# Patient Record
Sex: Male | Born: 1995 | Race: White | Hispanic: No | Marital: Single | Smoking: Never smoker
Health system: Southern US, Community
[De-identification: ages and names within clinical notes are randomized; demographics above are authoritative.]

## PROBLEM LIST (undated history)

## (undated) DIAGNOSIS — Z789 Other specified health status: Secondary | ICD-10-CM

## (undated) HISTORY — DX: Other specified health status: Z78.9

---
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
## 2018-01-08 ENCOUNTER — Encounter: Payer: Self-pay | Admitting: Family Medicine

## 2018-10-14 HISTORY — PX: WISDOM TOOTH EXTRACTION: SHX21

## 2020-01-13 ENCOUNTER — Ambulatory Visit: Payer: Medicaid Other | Attending: Internal Medicine

## 2020-01-13 DIAGNOSIS — Z23 Encounter for immunization: Secondary | ICD-10-CM

## 2020-01-13 NOTE — Progress Notes (Signed)
   Covid-19 Vaccination Clinic  Name:  Kenn Rekowski    MRN: [id number] DOB: December 12, 1995  01/13/2020  Mr. Nippert was observed post Covid-19 immunization for 15 minutes without incident. He was provided with Vaccine Information Sheet and instruction to access the V-Safe system.   Mr. Vanvranken was instructed to call 911 with any severe reactions post vaccine: Marland Kitchen Difficulty breathing  . Swelling of face and throat  . A fast heartbeat  . A bad rash all over body  . Dizziness and weakness   Immunizations Administered    Name Date Dose VIS Date Route   Pfizer COVID-19 Vaccine 01/13/2020  4:41 PM 0.3 mL 09/24/2019 Intramuscular   Manufacturer: [employer], [employer]   Lot: [id number]   NDC: [address]-9

## 2020-02-08 ENCOUNTER — Ambulatory Visit: Payer: Medicaid Other | Attending: Internal Medicine

## 2020-02-08 DIAGNOSIS — Z23 Encounter for immunization: Secondary | ICD-10-CM

## 2020-02-08 NOTE — Progress Notes (Signed)
   Covid-19 Vaccination Clinic  Name:  Max Hood    MRN: [id number] DOB: 10/16/1995  02/08/2020  [name] was observed post Covid-19 immunization for 15 minutes without incident. He was provided with Vaccine Information Sheet and instruction to access the V-Safe system.   [name] was instructed to call 911 with any severe reactions post vaccine: Marland Kitchen Difficulty breathing  . Swelling of face and throat  . A fast heartbeat  . A bad rash all over body  . Dizziness and weakness   Immunizations Administered    Name Date Dose VIS Date Route   Pfizer COVID-19 Vaccine 02/08/2020  4:21 PM 0.3 mL 12/08/2018 Intramuscular   Manufacturer: [employer], [employer]   Lot: [id number]   NDC: [address]-2

## 2020-07-05 ENCOUNTER — Telehealth: Payer: Self-pay

## 2020-07-05 NOTE — Telephone Encounter (Signed)
LMOM for ov on 9/23

## 2020-07-06 ENCOUNTER — Ambulatory Visit: Payer: Medicaid Other | Admitting: Nurse Practitioner

## 2020-08-02 ENCOUNTER — Telehealth: Payer: Self-pay

## 2020-08-02 NOTE — Telephone Encounter (Signed)
Lmom to confirm and screen for 08-03-20 ov. 

## 2020-08-03 ENCOUNTER — Telehealth: Payer: Self-pay

## 2020-08-03 ENCOUNTER — Ambulatory Visit: Payer: Medicaid Other | Admitting: Hospice and Palliative Medicine

## 2020-08-03 NOTE — Telephone Encounter (Signed)
NEW PATIENT HAS MISSED TWICE FOR NEW PT APPT DO NOT SCHEDULE WITH [facility]

## 2020-11-03 ENCOUNTER — Emergency Department: Payer: Medicaid Other

## 2020-11-03 ENCOUNTER — Emergency Department
Admission: EM | Admit: 2020-11-03 | Discharge: 2020-11-03 | Disposition: A | Discharge status: Home / Self Care | Payer: Medicaid Other | Attending: Emergency Medicine | Admitting: Emergency Medicine

## 2020-11-03 ENCOUNTER — Encounter: Payer: Self-pay | Admitting: Emergency Medicine

## 2020-11-03 ENCOUNTER — Other Ambulatory Visit: Payer: Self-pay

## 2020-11-03 DIAGNOSIS — Y92219 Unspecified school as the place of occurrence of the external cause: Secondary | ICD-10-CM | POA: Insufficient documentation

## 2020-11-03 DIAGNOSIS — Y990 Civilian activity done for income or pay: Secondary | ICD-10-CM | POA: Insufficient documentation

## 2020-11-03 DIAGNOSIS — S93401A Sprain of unspecified ligament of right ankle, initial encounter: Secondary | ICD-10-CM | POA: Insufficient documentation

## 2020-11-03 DIAGNOSIS — M25571 Pain in right ankle and joints of right foot: Secondary | ICD-10-CM

## 2020-11-03 DIAGNOSIS — W1840XA Slipping, tripping and stumbling without falling, unspecified, initial encounter: Secondary | ICD-10-CM | POA: Insufficient documentation

## 2020-11-03 MED ORDER — MELOXICAM 15 MG PO TABS: 15.0000 mg | ORAL_TABLET | Freq: Every day | ORAL | 0 refills | Status: AC

## 2020-11-03 NOTE — ED Triage Notes (Signed)
Pt to ED via POV, pt states that he was at work today and slipped in some water and twisted his right ankle. Pt states that he works at [employer] at [employer]. Pt is able to put weight on his ankle. Pt is in NAD.

## 2020-11-04 NOTE — ED Provider Notes (Signed)
[facility] Emergency Department Provider Note  ____________________________________________   Event Date/Time   First MD Initiated Contact with Patient 11/03/20 1633     (approximate)  I have reviewed the triage vital signs and the nursing notes.   HISTORY  Chief Complaint Ankle Pain  HPI Max Hood is a 25 y.o. male who presents to the emergency department for evaluation of right ankle pain.  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  The patient rates his pain a 7 out of 10.  He states that he laid on the ground initially for about 5 minutes but was then able to get up and become productive.  He has been able to weight-bear since that time.  He denies any weakness, numbness or tingling.  He has not tried any other alleviating measures.  Pain is worse with ambulation.         History reviewed. No pertinent past medical history.  There are no problems to display for this patient.   History reviewed. No pertinent surgical history.  Prior to Admission medications   Medication Sig Start Date End Date Taking? Authorizing Provider  meloxicam (MOBIC) 15 MG tablet Take 1 tablet (15 mg total) by mouth daily for 15 days. 11/03/20 11/18/20 Yes Lucy Chris, PA    Allergies Patient has no known allergies.  No family history on file.  Social History Social History   Tobacco Use  . Smoking status: Never Smoker  . Smokeless tobacco: Never Used  Substance Use Topics  . Alcohol use: Not Currently  . Drug use: Not Currently    Review of Systems Constitutional: No fever/chills Eyes: No visual changes. ENT: No sore throat. Cardiovascular: Denies chest pain. Respiratory: Denies shortness of breath. Gastrointestinal: No abdominal pain.  No nausea, no vomiting.  No diarrhea.  No constipation. Genitourinary: Negative for  dysuria. Musculoskeletal: + Right ankle pain, negative for back pain. Skin: Negative for rash. Neurological: Negative for headaches, focal weakness or numbness.   ____________________________________________   PHYSICAL EXAM:  VITAL SIGNS: ED Triage Vitals  Enc Vitals Group     BP 11/03/20 1440 125/70     Pulse Rate 11/03/20 1440 92     Resp 11/03/20 1440 16     Temp 11/03/20 1440 98.5 F (36.9 C)     Temp Source 11/03/20 1440 Oral     SpO2 11/03/20 1440 98 %     Weight 11/03/20 1441 165 lb (74.8 kg)     Height 11/03/20 1441 5\' 10"  (1.778 m)     Head Circumference --      Peak Flow --      Pain Score 11/03/20 1441 7     Pain Loc --      Pain Edu? --      Excl. in GC? --    Constitutional: Alert and oriented. Well appearing and in no acute distress. Eyes: Conjunctivae are normal. PERRL. EOMI. Head: Atraumatic. Nose: No congestion/rhinnorhea. Mouth/Throat: Mucous membranes are moist.  Neck: No stridor.   Musculoskeletal: There is mild soft tissue swelling to the dorsum and lateral aspect of the right foot.  There is tenderness over the region of the ATFL.  There is no tenderness over the distal fibula, proximal fifth metatarsal.  Patient has full range of motion of the right ankle with pain at end  range.  Dorsal pedal pulse 2+.  Capillary refill less than 3 seconds. Neurologic:  Normal speech and language. No gross focal neurologic deficits are appreciated. No gait instability. Skin:  Skin is warm, dry and intact. No rash noted. Psychiatric: Mood and affect are normal. Speech and behavior are normal.    ____________________________________________  RADIOLOGY I, Lucy Chris, personally viewed and evaluated these images (plain radiographs) as part of my medical decision making, as well as reviewing the written report by the radiologist.  ED provider interpretation: No acute fracture identified of the right  ankle  ____________________________________________    INITIAL IMPRESSION / ASSESSMENT AND PLAN / ED COURSE  As part of my medical decision making, I reviewed the following data within the electronic MEDICAL RECORD NUMBER Nursing notes reviewed and incorporated and Radiograph reviewed         Patient is a 25 year old male who presents to the emergency department for evaluation of right ankle pain after he got it stuck under the front wheel of an airplane at his flight school earlier today.  See HPI for further details.  On physical exam, the patient has tenderness mostly in the soft tissue region of the lateral ankle.  There is no bony tenderness noted.  Patient is neurovascularly intact.  X-rays reveal no acute fracture.  Most likely an acute ankle sprain.  We will provide the patient a lace up ankle brace.  Recommended Tylenol and Mobic for pain and symptom management.  The patient is amenable with this plan he stable at time for outpatient therapy.  He will follow-up with Ortho if he has any lingering deficits or return for any acute worsening.      ____________________________________________   FINAL CLINICAL IMPRESSION(S) / ED DIAGNOSES  Final diagnoses:  Acute right ankle pain  Sprain of right ankle, unspecified ligament, initial encounter     ED Discharge Orders         Ordered    meloxicam (MOBIC) 15 MG tablet  Daily        11/03/20 1639          *Please note:  Max Hood was evaluated in Emergency Department on 11/04/2020 for the symptoms described in the history of present illness. He was evaluated in the context of the global COVID-19 pandemic, which necessitated consideration that the patient might be at risk for infection with the SARS-CoV-2 virus that causes COVID-19. Institutional protocols and algorithms that pertain to the evaluation of patients at risk for COVID-19 are in a state of rapid change based on information released by regulatory bodies including the CDC  and federal and state organizations. These policies and algorithms were followed during the patient's care in the ED.  Some ED evaluations and interventions may be delayed as a result of limited staffing during and the pandemic.*   Note:  This document was prepared using Dragon voice recognition software and may include unintentional dictation errors.   Lucy Chris, PA 11/04/20 2059    Jene Every, MD 11/05/20 [phone]

## 2021-04-12 IMAGING — CR DG ANKLE COMPLETE 3+V*R*
1 series · 3 of 3 positions shown · non-contrast
Comparison: None.

CLINICAL DATA: Right ankle pain after fall today.

EXAM:
RIGHT ANKLE - COMPLETE 3+ VIEW

[Series 1: dg ankle complete right · 0.14mm/px · 3 of 3 slices shown]
[im 1/3]
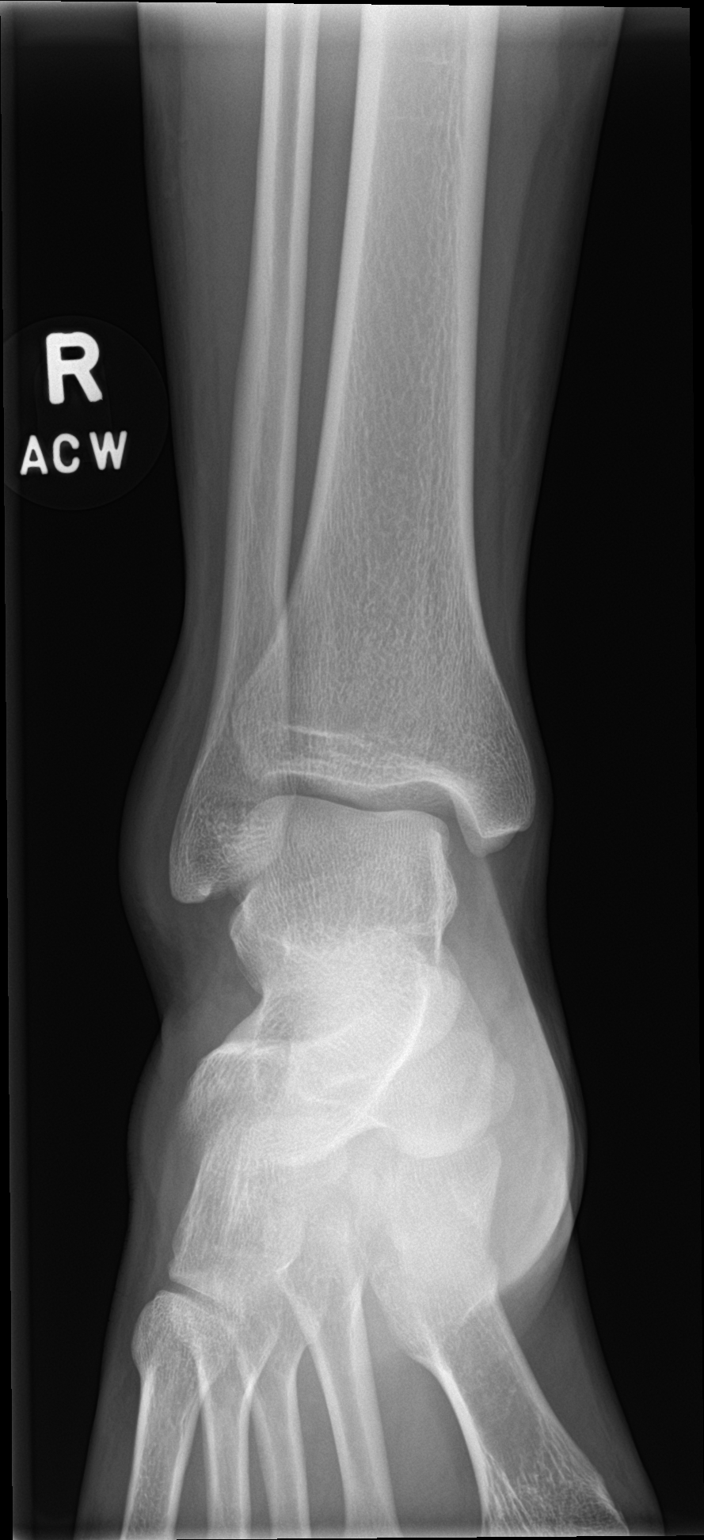
[im 2/3]
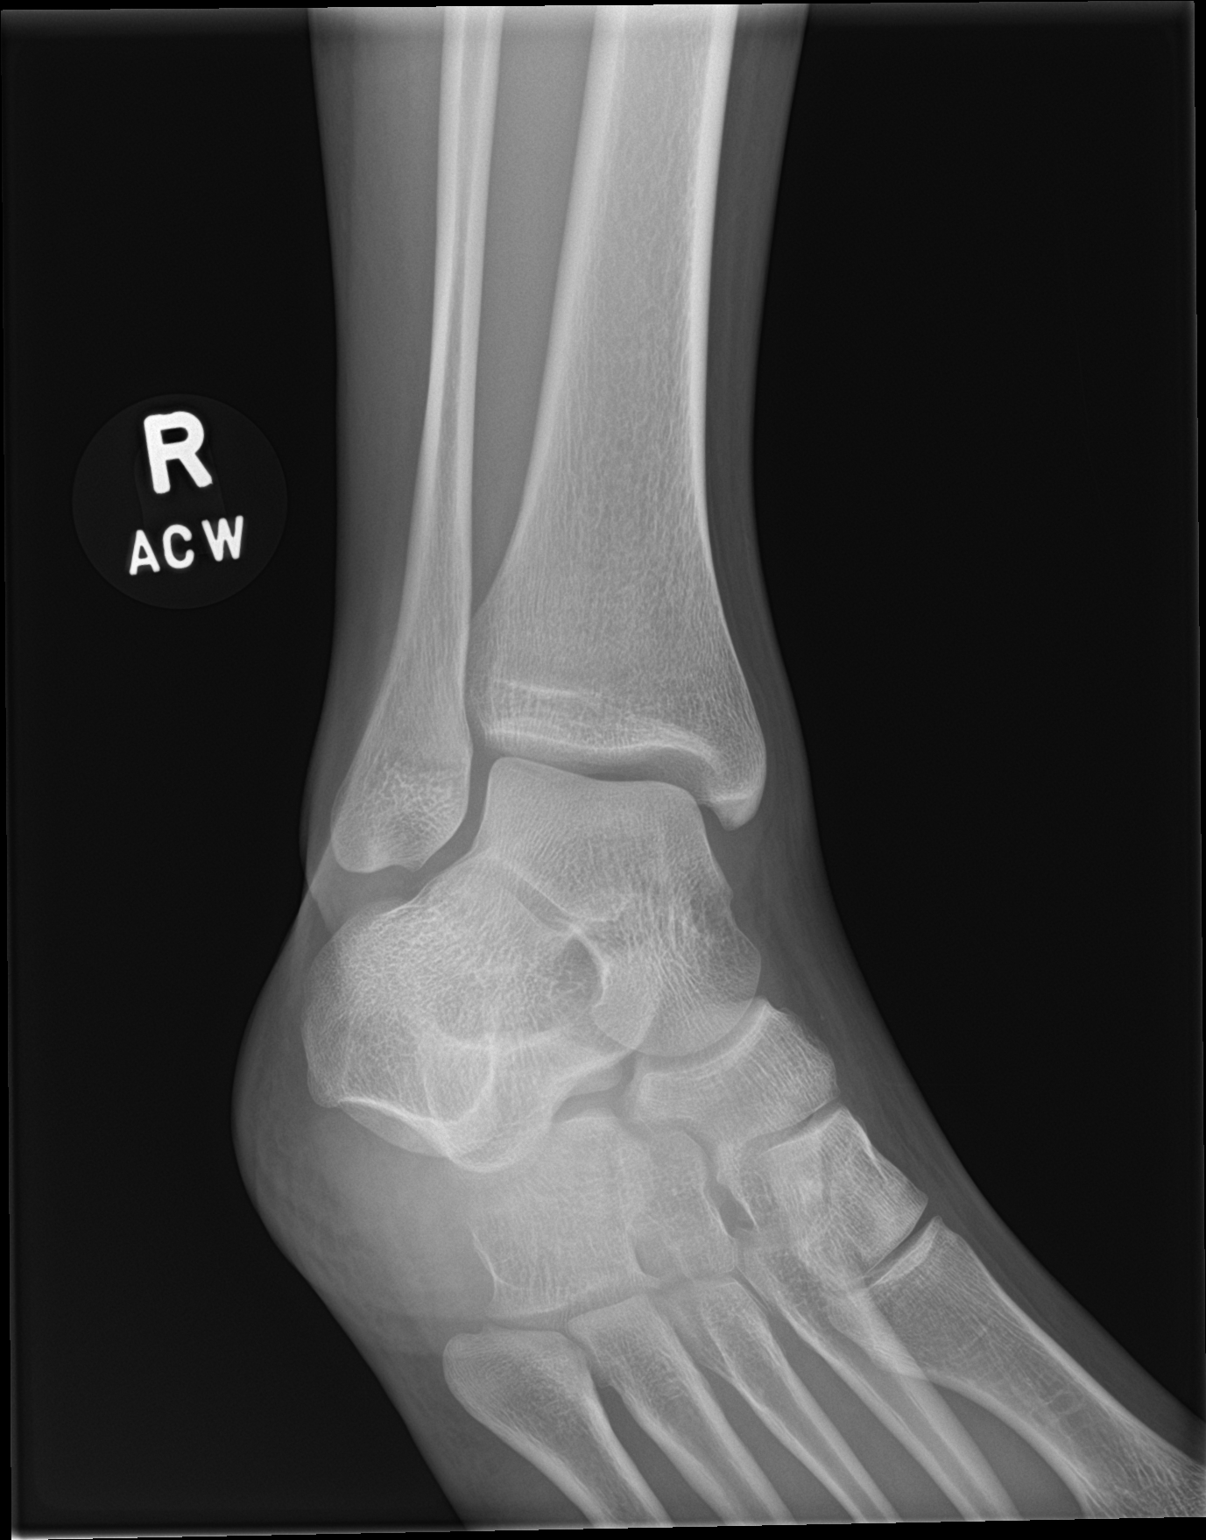
[im 3/3]
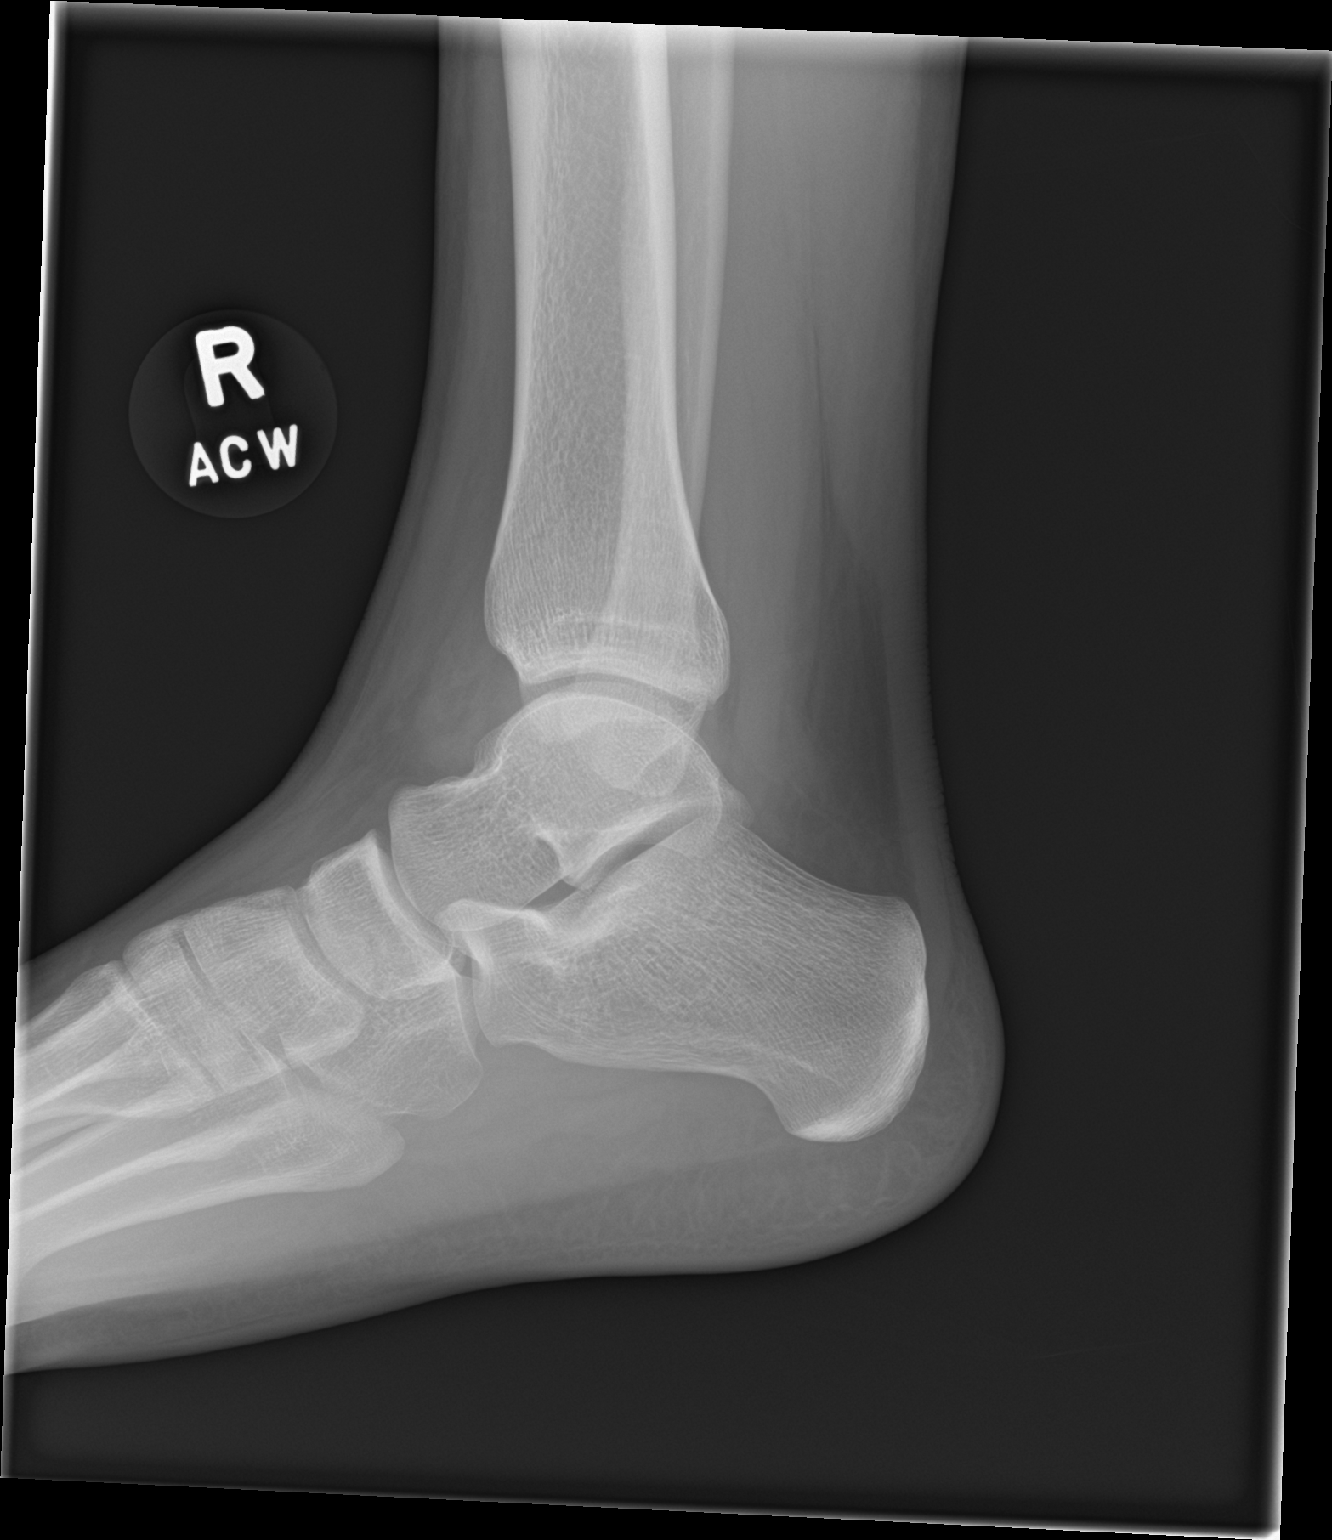

[3 of 3 positions shown; findings below may reference images not displayed]

FINDINGS: There is no evidence of fracture, dislocation, or joint effusion.
There is no evidence of arthropathy or other focal bone abnormality.
Mild soft tissue swelling is seen over lateral malleolus.
IMPRESSION: No fracture or dislocation is noted. Mild soft tissue swelling is
seen over lateral malleolus.

## 2021-06-28 ENCOUNTER — Ambulatory Visit: Payer: BLUE CROSS/BLUE SHIELD | Admitting: Internal Medicine

## 2021-06-29 ENCOUNTER — Telehealth: Payer: Self-pay

## 2021-07-02 NOTE — Telephone Encounter (Signed)
error 

## 2021-07-23 ENCOUNTER — Ambulatory Visit: Payer: BLUE CROSS/BLUE SHIELD | Admitting: Internal Medicine

## 2021-07-30 ENCOUNTER — Encounter: Payer: Self-pay | Admitting: Internal Medicine

## 2021-07-30 ENCOUNTER — Ambulatory Visit (INDEPENDENT_AMBULATORY_CARE_PROVIDER_SITE_OTHER): Payer: BLUE CROSS/BLUE SHIELD | Admitting: Internal Medicine

## 2021-07-30 ENCOUNTER — Other Ambulatory Visit: Payer: Self-pay

## 2021-07-30 VITALS — BP 112/68 | HR 70 | Temp 98.3°F | Resp 16 | Ht 70.0 in | Wt 157.8 lb

## 2021-07-30 DIAGNOSIS — F4329 Adjustment disorder with other symptoms: Secondary | ICD-10-CM | POA: Diagnosis not present

## 2021-07-30 DIAGNOSIS — F411 Generalized anxiety disorder: Secondary | ICD-10-CM

## 2021-07-30 DIAGNOSIS — Z0001 Encounter for general adult medical examination with abnormal findings: Secondary | ICD-10-CM

## 2021-07-30 NOTE — Progress Notes (Signed)
[facility] [address] [place], [place] [address]  Internal MEDICINE  Office Visit Note  Patient Name: Max Hood  [id number]  [id number]  Date of Service: 08/14/2021   Complaints/HPI Pt is here for establishment of PCP. Chief Complaint  Patient presents with   New Patient (Initial Visit)    To establish care   Anxiety    Possible.  Pt has experienced in the mornings having SOB and trouble concentrating on certain task   ADHD    Pt may need to be evaluated and feels he has problems concentrating on certain task.  When sitting and trying to do things he feels he gets inpatient and can't stay focused    lab testing    Not had any blood work done in many years, mother is requesting lab work   HPI Pt is here for establishment of PCP and CPE. He feels well. Has a full time job and loves it, Works at [employer] in [employer] He takes [employer], at times he feels stressed out. Does not have time and procrastinates. Wants to go to the gym but does not have time He does not feel depressed     Current Medication: No outpatient encounter medications on file as of 07/30/2021.   No facility-administered encounter medications on file as of 07/30/2021.    Surgical History: Past Surgical History:  Procedure Laterality Date   WISDOM TOOTH EXTRACTION  2020   3 teeth removed    Medical History: Past Medical History:  Diagnosis Date   No pertinent past medical history     Family History: Family History  Problem Relation Age of Onset   Diabetes Father    Hypertension Father    Glaucoma Father    Sleep apnea Father    ADD / ADHD Brother     Social History   Socioeconomic History   Marital status: Single    Spouse name: Not on file   Number of children: Not on file   Years of education: Not on file   Highest education level: Not on file  Occupational History   Not on file  Tobacco Use   Smoking status: Never   Smokeless tobacco: Never   Substance and Sexual Activity   Alcohol use: Not Currently   Drug use: Not Currently   Sexual activity: Not on file  Other Topics Concern   Not on file  Social History Narrative   Not on file   Social Determinants of Health   Financial Resource Strain: Not on file  Food Insecurity: Not on file  Transportation Needs: Not on file  Physical Activity: Not on file  Stress: Not on file  Social Connections: Not on file  Intimate Partner Violence: Not on file     Review of Systems  Constitutional:  Negative for chills, fatigue and unexpected weight change.  HENT:  Negative for congestion, postnasal drip, rhinorrhea, sneezing and sore throat.   Eyes:  Negative for redness.  Respiratory:  Negative for cough, chest tightness and shortness of breath.   Cardiovascular:  Negative for chest pain and palpitations.  Gastrointestinal:  Negative for abdominal pain, constipation, diarrhea, nausea and vomiting.  Genitourinary:  Negative for dysuria and frequency.  Musculoskeletal:  Negative for arthralgias, back pain, joint swelling and neck pain.  Skin:  Negative for rash.  Neurological: Negative.  Negative for tremors and numbness.  Hematological:  Negative for adenopathy. Does not bruise/bleed easily.  Psychiatric/Behavioral:  Negative for behavioral problems (Depression), sleep disturbance  and suicidal ideas. The patient is not nervous/anxious.    Vital Signs: BP 112/68   Pulse 70   Temp 98.3 F (36.8 C)   Resp 16   Ht 5\' 10"  (1.778 m)   Wt 157 lb 12.8 oz (71.6 kg)   SpO2 97%   BMI 22.64 kg/m    Physical Exam Constitutional:      Appearance: Normal appearance.  HENT:     Head: Normocephalic and atraumatic.     Nose: Nose normal.     Mouth/Throat:     Mouth: Mucous membranes are moist.     Pharynx: No posterior oropharyngeal erythema.  Eyes:     Extraocular Movements: Extraocular movements intact.     Pupils: Pupils are equal, round, and reactive to light.  Cardiovascular:      Pulses: Normal pulses.     Heart sounds: Normal heart sounds.  Pulmonary:     Effort: Pulmonary effort is normal.     Breath sounds: Normal breath sounds.  Neurological:     General: No focal deficit present.     Mental Status: He is alert.  Psychiatric:        Mood and Affect: Mood normal.        Behavior: Behavior normal.  GAD 7 : Generalized Anxiety Score 08/14/2021  Nervous, Anxious, on Edge 0  Control/stop worrying 1  Worry too much - different things 1  Trouble relaxing 1  Easily annoyed or irritable 0  Afraid - awful might happen 1  Anxiety Difficulty Not difficult at all   Adult ADHD Self Report Scale (most recent)     Adult ADHD Self-Report Scale (ASRS-v1.1) Symptom Checklist - 08/14/21 1628       Part A   1. How often do you have trouble wrapping up the final details of a project, once the challenging parts have been done? Sometimes  2. How often do you have difficulty getting things done in order when you have to do a task that requires organization? Sometimes    3. How often do you have problems remembering appointments or obligations? Sometimes  4. When you have a task that requires a lot of thought, how often do you avoid or delay getting started? Sometimes    5. How often do you fidget or squirm with your hands or feet when you have to sit down for a long time? Often  6. How often do you feel overly active and compelled to do things, like you were driven by a motor? Sometimes      Part B   7. How often do you make careless mistakes when you have to work on a boring or difficult project? Never  8. How often do you have difficulty keeping your attention when you are doing boring or repetitive work? Never    9. How often do you have difficulty concentrating on what people say to you, even when they are speaking to you directly? Never  10. How often do you misplace or have difficulty finding things at home or at work? Rarely    11. How often are you distracted by  activity or noise around you? Rarely  12. How often do you leave your seat in meetings or other situations in which you are expected to remain seated? Never    13. How often do you feel restless or fidgety? Never  14. How often do you have difficulty unwinding and relaxing when you have time to yourself? Sometimes  15. How often do you find yourself talking too much when you are in social situations? Sometimes  16. When you are in a conversation, how often do you find yourself finishing the sentences of the people you are talking to, before they can finish them themselves? Never    17. How often do you have difficulty waiting your turn in situations when turn taking is required? Never  18. How often do you interrupt others when they are busy? Never                  Assessment/Plan:  1. Encounter for general adult medical examination with abnormal findings Update all age appropriate labs  - CBC with Differential/Platelet - Lipid Panel With LDL/HDL Ratio - TSH - T4, free - Comprehensive metabolic panel  2. GAD (generalized anxiety disorder) This is work life balance, pt is instructed to monitor his symptoms   3. Stress and adjustment reaction Pt is instructed to set short term goals for his work, Stimulants is not a good idea due to his work with [employer] Financial [employer] Counseling: Kenshawn verbalizes understanding of the findings of todays visit and agrees with plan of treatment. I have discussed any further diagnostic evaluation that may be needed or ordered today. We also reviewed his medications today. he has been encouraged to call the office with any questions or concerns that should arise related to todays visit.    Counseling:  [place] Controlled Substance Database was reviewed by me.  Orders Placed This Encounter  Procedures   CBC with Differential/Platelet   Lipid Panel With LDL/HDL Ratio   TSH   T4, free   Comprehensive metabolic panel    No orders of the defined types  were placed in this encounter.   Time spent:30 Minutes

## 2021-09-27 LAB — COMPREHENSIVE METABOLIC PANEL
ALT: 13 IU/L (ref 0–44)
AST: 18 IU/L (ref 0–40)
Albumin/Globulin Ratio: 2.5 — ABNORMAL HIGH (ref 1.2–2.2)
Albumin: 4.9 g/dL (ref 4.1–5.2)
Alkaline Phosphatase: 66 IU/L (ref 44–121)
BUN/Creatinine Ratio: 17 (ref 9–20)
BUN: 16 mg/dL (ref 6–20)
Bilirubin Total: 0.5 mg/dL (ref 0.0–1.2)
CO2: 24 mmol/L (ref 20–29)
Calcium: 9.8 mg/dL (ref 8.7–10.2)
Chloride: 101 mmol/L (ref 96–106)
Creatinine, Ser: 0.94 mg/dL (ref 0.76–1.27)
Globulin, Total: 2 g/dL (ref 1.5–4.5)
Glucose: 94 mg/dL (ref 70–99)
Potassium: 4.6 mmol/L (ref 3.5–5.2)
Sodium: 141 mmol/L (ref 134–144)
Total Protein: 6.9 g/dL (ref 6.0–8.5)
eGFR: 117 mL/min/{1.73_m2} (ref >59)

## 2021-09-27 LAB — T4, FREE: Free T4: 1.17 ng/dL (ref 0.82–1.77)

## 2021-09-27 LAB — CBC WITH DIFFERENTIAL/PLATELET
Basophils Absolute: 0 10*3/uL (ref 0.0–0.2)
Basos: 1 %
EOS (ABSOLUTE): 0.1 10*3/uL (ref 0.0–0.4)
Eos: 2 %
Hematocrit: 45.6 % (ref 37.5–51.0)
Hemoglobin: 14.8 g/dL (ref 13.0–17.7)
Immature Grans (Abs): 0 10*3/uL (ref 0.0–0.1)
Immature Granulocytes: 0 %
Lymphocytes Absolute: 1.5 10*3/uL (ref 0.7–3.1)
Lymphs: 28 %
MCH: 30.5 pg (ref 26.6–33.0)
MCHC: 32.5 g/dL (ref 31.5–35.7)
MCV: 94 fL (ref 79–97)
Monocytes Absolute: 0.6 10*3/uL (ref 0.1–0.9)
Monocytes: 11 %
Neutrophils Absolute: 3.1 10*3/uL (ref 1.4–7.0)
Neutrophils: 58 %
Platelets: 239 10*3/uL (ref 150–450)
RBC: 4.85 x10E6/uL (ref 4.14–5.80)
RDW: 12.8 % (ref 11.6–15.4)
WBC: 5.2 10*3/uL (ref 3.4–10.8)

## 2021-09-27 LAB — LIPID PANEL WITH LDL/HDL RATIO
Cholesterol, Total: 131 mg/dL (ref 100–199)
HDL: 41 mg/dL (ref >39)
LDL Chol Calc (NIH): 79 mg/dL (ref 0–99)
LDL/HDL Ratio: 1.9 ratio (ref 0.0–3.6)
Triglycerides: 50 mg/dL (ref 0–149)
VLDL Cholesterol Cal: 11 mg/dL (ref 5–40)

## 2021-09-27 LAB — TSH: TSH: 1.42 u[IU]/mL (ref 0.450–4.500)

## 2021-10-01 ENCOUNTER — Telehealth: Payer: Self-pay

## 2021-10-01 NOTE — Telephone Encounter (Signed)
-----   Message from Lyndon Code, MD sent at 10/01/2021  7:29 AM EST ----- Labs WNL

## 2021-10-01 NOTE — Telephone Encounter (Signed)
LMOM that all labs were WNL

## 2021-10-01 NOTE — Progress Notes (Signed)
Labs WNL.

## 2022-01-31 ENCOUNTER — Ambulatory Visit: Payer: Medicaid Other | Admitting: Physician Assistant

## 2022-04-19 DIAGNOSIS — M25511 Pain in right shoulder: Secondary | ICD-10-CM | POA: Diagnosis not present

## 2022-04-19 DIAGNOSIS — S46911A Strain of unspecified muscle, fascia and tendon at shoulder and upper arm level, right arm, initial encounter: Secondary | ICD-10-CM | POA: Diagnosis not present

## 2022-04-19 DIAGNOSIS — M7591 Shoulder lesion, unspecified, right shoulder: Secondary | ICD-10-CM | POA: Diagnosis not present

## 2024-03-31 ENCOUNTER — Ambulatory Visit (INDEPENDENT_AMBULATORY_CARE_PROVIDER_SITE_OTHER): Admitting: Nurse Practitioner

## 2024-03-31 ENCOUNTER — Encounter: Payer: Self-pay | Admitting: Nurse Practitioner

## 2024-03-31 VITALS — BP 130/84 | HR 84 | Temp 98.4°F | Resp 16 | Ht 70.0 in | Wt 194.5 lb

## 2024-03-31 DIAGNOSIS — E782 Mixed hyperlipidemia: Secondary | ICD-10-CM | POA: Diagnosis not present

## 2024-03-31 DIAGNOSIS — B3742 Candidal balanitis: Secondary | ICD-10-CM | POA: Diagnosis not present

## 2024-03-31 DIAGNOSIS — Z0000 Encounter for general adult medical examination without abnormal findings: Secondary | ICD-10-CM

## 2024-03-31 MED ORDER — CLOTRIMAZOLE-BETAMETHASONE 1-0.05 % EX CREA: 1.0000 | TOPICAL_CREAM | Freq: Two times a day (BID) | CUTANEOUS | 0 refills | Status: AC

## 2024-03-31 NOTE — Progress Notes (Signed)
 [facility] [address] [place], [place] [address]  Internal MEDICINE  Office Visit Note  Patient Name: Max Hood  [id number]  [id number]  Date of Service: 03/31/2024  Chief Complaint  Patient presents with   Acute Visit    3 weeks friction.     HPI Mickeal presents for an acute sick visit for rash of penis  --onset was about 3 weeks ago, noticed the next morning after having  Reports that it was initially painful, red dots, friction, no blistering, looks chaffed.   Also needs annual CPE scheduled and routine labs ordered        Current Medication:  Outpatient Encounter Medications as of 03/31/2024  Medication Sig   clotrimazole-betamethasone (LOTRISONE) cream Apply 1 Application topically 2 (two) times daily. To rash until resolved.   No facility-administered encounter medications on file as of 03/31/2024.      Medical History: Past Medical History:  Diagnosis Date   No pertinent past medical history      Vital Signs: BP 130/84   Pulse 84   Temp 98.4 F (36.9 C)   Resp 16   Ht 5' 10 (1.778 m)   Wt 194 lb 8 oz (88.2 kg)   SpO2 97%   BMI 27.91 kg/m    Review of Systems  Genitourinary:        Rash on the head of the penis -- red slightly itchy spots    Physical Exam Vitals reviewed.  Genitourinary:    Penis: Uncircumcised. Lesions (red macular rash) present.        Assessment/Plan: 1. Candidiasis of penis (Primary) Topical medication prescribed.  - clotrimazole-betamethasone (LOTRISONE) cream; Apply 1 Application topically 2 (two) times daily. To rash until resolved.  Dispense: 45 g; Refill: 0  2. Mixed hyperlipidemia Routine labs ordered  - CBC with Differential/Platelet - CMP14+EGFR - Lipid Profile  3. Routine health maintenance Routine labs ordered  - CBC with Differential/Platelet - CMP14+EGFR - Lipid Profile   General Counseling: Oddis verbalizes understanding of the findings of todays visit and agrees with  plan of treatment. I have discussed any further diagnostic evaluation that may be needed or ordered today. We also reviewed his medications today. he has been encouraged to call the office with any questions or concerns that should arise related to todays visit.    Counseling:    Orders Placed This Encounter  Procedures   CBC with Differential/Platelet   CMP14+EGFR   Lipid Profile    Meds ordered this encounter  Medications   clotrimazole-betamethasone (LOTRISONE) cream    Sig: Apply 1 Application topically 2 (two) times daily. To rash until resolved.    Dispense:  45 g    Refill:  0    Return if symptoms worsen or fail to improve.  [place] Controlled Substance Database was reviewed by me for overdose risk score (ORS)  Time spent:30 Minutes Time spent with patient included reviewing progress notes, labs, imaging studies, and discussing plan for follow up.   This patient was seen by Laurence Pons, FNP-C in collaboration with Dr. Verneta Gone as a part of collaborative care agreement.  Yuridiana Formanek R. Bobbi Burow, MSN, FNP-C Internal Medicine

## 2024-04-08 ENCOUNTER — Telehealth: Payer: Self-pay | Admitting: Nurse Practitioner

## 2024-04-08 NOTE — Telephone Encounter (Signed)
 Left vm to confirm 04/15/24 appointment-Toni

## 2024-04-15 ENCOUNTER — Encounter: Admitting: Nurse Practitioner

## 2024-04-20 ENCOUNTER — Encounter: Admitting: Nurse Practitioner
